# Patient Record
Sex: Female | Born: 1995 | Race: White | Hispanic: No | Marital: Single | Smoking: Never smoker
Health system: Southern US, Community
[De-identification: ages and names within clinical notes are randomized; demographics above are authoritative.]

---
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
## 2015-04-10 ENCOUNTER — Emergency Department (HOSPITAL_COMMUNITY)
Admission: EM | Admit: 2015-04-10 | Discharge: 2015-04-10 | Disposition: A | Discharge status: Home / Self Care | Payer: 59 | Attending: Emergency Medicine | Admitting: Emergency Medicine

## 2015-04-10 ENCOUNTER — Encounter (HOSPITAL_COMMUNITY): Payer: Self-pay

## 2015-04-10 DIAGNOSIS — F1010 Alcohol abuse, uncomplicated: Secondary | ICD-10-CM | POA: Insufficient documentation

## 2015-04-10 DIAGNOSIS — Z3202 Encounter for pregnancy test, result negative: Secondary | ICD-10-CM | POA: Diagnosis not present

## 2015-04-10 DIAGNOSIS — Z880 Allergy status to penicillin: Secondary | ICD-10-CM | POA: Insufficient documentation

## 2015-04-10 DIAGNOSIS — K2920 Alcoholic gastritis without bleeding: Secondary | ICD-10-CM | POA: Diagnosis not present

## 2015-04-10 DIAGNOSIS — R1013 Epigastric pain: Secondary | ICD-10-CM | POA: Diagnosis present

## 2015-04-10 LAB — COMPREHENSIVE METABOLIC PANEL
ALBUMIN: 4.7 g/dL (ref 3.5–5.2)
ALK PHOS: 55 U/L (ref 39–117)
ALT: 85 U/L — ABNORMAL HIGH (ref 0–35)
AST: 89 U/L — AB (ref 0–37)
Anion gap: 15 (ref 5–15)
BILIRUBIN TOTAL: 0.8 mg/dL (ref 0.3–1.2)
BUN: 11 mg/dL (ref 6–23)
CHLORIDE: 109 mmol/L (ref 96–112)
CO2: 17 mmol/L — ABNORMAL LOW (ref 19–32)
Calcium: 9.8 mg/dL (ref 8.4–10.5)
Creatinine, Ser: 0.7 mg/dL (ref 0.50–1.10)
GFR calc Af Amer: >90 mL/min (ref >90)
Glucose, Bld: 111 mg/dL — ABNORMAL HIGH (ref 70–99)
POTASSIUM: 3.3 mmol/L — AB (ref 3.5–5.1)
SODIUM: 141 mmol/L (ref 135–145)
Total Protein: 7.7 g/dL (ref 6.0–8.3)

## 2015-04-10 LAB — CBC WITH DIFFERENTIAL/PLATELET
BASOS ABS: 0 10*3/uL (ref 0.0–0.1)
BASOS PCT: 0 % (ref 0–1)
EOS PCT: 0 % (ref 0–5)
Eosinophils Absolute: 0 10*3/uL (ref 0.0–0.7)
HCT: 36.6 % (ref 36.0–46.0)
HEMOGLOBIN: 12.4 g/dL (ref 12.0–15.0)
LYMPHS ABS: 0.6 10*3/uL — AB (ref 0.7–4.0)
LYMPHS PCT: 7 % — AB (ref 12–46)
MCH: 31.4 pg (ref 26.0–34.0)
MCHC: 33.9 g/dL (ref 30.0–36.0)
MCV: 92.7 fL (ref 78.0–100.0)
Monocytes Absolute: 0.2 10*3/uL (ref 0.1–1.0)
Monocytes Relative: 2 % — ABNORMAL LOW (ref 3–12)
NEUTROS ABS: 7.4 10*3/uL (ref 1.7–7.7)
NEUTROS PCT: 91 % — AB (ref 43–77)
Platelets: 263 10*3/uL (ref 150–400)
RBC: 3.95 MIL/uL (ref 3.87–5.11)
RDW: 11.9 % (ref 11.5–15.5)
WBC: 8.1 10*3/uL (ref 4.0–10.5)

## 2015-04-10 LAB — PREGNANCY, URINE: Preg Test, Ur: NEGATIVE

## 2015-04-10 LAB — URINALYSIS, ROUTINE W REFLEX MICROSCOPIC
BILIRUBIN URINE: NEGATIVE
Glucose, UA: NEGATIVE mg/dL
HGB URINE DIPSTICK: NEGATIVE
Ketones, ur: NEGATIVE mg/dL
Leukocytes, UA: NEGATIVE
Nitrite: NEGATIVE
Protein, ur: NEGATIVE mg/dL
SPECIFIC GRAVITY, URINE: 1.015 (ref 1.005–1.030)
UROBILINOGEN UA: 0.2 mg/dL (ref 0.0–1.0)
pH: 6 (ref 5.0–8.0)

## 2015-04-10 LAB — LIPASE, BLOOD: Lipase: 17 U/L (ref 11–59)

## 2015-04-10 MED ORDER — SODIUM CHLORIDE 0.9 % IV BOLUS (SEPSIS)
1000.0000 mL | Freq: Once | INTRAVENOUS | Status: AC
  Administered 2015-04-10: 1000 mL via INTRAVENOUS

## 2015-04-10 MED ORDER — ONDANSETRON HCL 4 MG/2ML IJ SOLN
4.0000 mg | Freq: Once | INTRAMUSCULAR | Status: AC
  Administered 2015-04-10: 4 mg via INTRAVENOUS
  Filled 2015-04-10: qty 2

## 2015-04-10 MED ORDER — PANTOPRAZOLE SODIUM 40 MG IV SOLR
40.0000 mg | Freq: Once | INTRAVENOUS | Status: AC
  Administered 2015-04-10: 40 mg via INTRAVENOUS
  Filled 2015-04-10: qty 40

## 2015-04-10 MED ORDER — OMEPRAZOLE 20 MG PO CPDR: 20.0000 mg | DELAYED_RELEASE_CAPSULE | Freq: Every day | ORAL | Status: DC

## 2015-04-10 MED ORDER — MORPHINE SULFATE 4 MG/ML IJ SOLN
4.0000 mg | Freq: Once | INTRAMUSCULAR | Status: AC
  Administered 2015-04-10: 4 mg via INTRAVENOUS
  Filled 2015-04-10: qty 1

## 2015-04-10 MED ORDER — ONDANSETRON 8 MG PO TBDP: 8.0000 mg | ORAL_TABLET | Freq: Three times a day (TID) | ORAL | Status: DC | PRN

## 2015-04-10 NOTE — Discharge Instructions (Signed)
Gastritis, Adult °Gastritis is soreness and puffiness (inflammation) of the lining of the stomach. If you do not get help, gastritis can cause bleeding and sores (ulcers) in the stomach. °HOME CARE  °· Only take medicine as told by your doctor. °· If you were given antibiotic medicines, take them as told. Finish the medicines even if you start to feel better. °· Drink enough fluids to keep your pee (urine) clear or pale yellow. °· Avoid foods and drinks that make your problems worse. Foods you may want to avoid include: °¨ Caffeine or alcohol. °¨ Chocolate. °¨ Mint. °¨ Garlic and onions. °¨ Spicy foods. °¨ Citrus fruits, including oranges, lemons, or limes. °¨ Food containing tomatoes, including sauce, chili, salsa, and pizza. °¨ Fried and fatty foods. °· Eat small meals throughout the day instead of large meals. °GET HELP RIGHT AWAY IF:  °· You have black or dark red poop (stools). °· You throw up (vomit) blood. It may look like coffee grounds. °· You cannot keep fluids down. °· Your belly (abdominal) pain gets worse. °· You have a fever. °· You do not feel better after 1 week. °· You have any other questions or concerns. °MAKE SURE YOU:  °· Understand these instructions. °· Will watch your condition. °· Will get help right away if you are not doing well or get worse. °Document Released: 05/25/2008 Document Revised: 02/29/2012 Document Reviewed: 01/20/2012 °[employer]® Patient Information ©2015 [employer]. This information is not intended to replace advice given to you by your health care provider. Make sure you discuss any questions you have with your health care provider. ° °

## 2015-04-10 NOTE — Progress Notes (Signed)
Pt listed in EPIC without insurance coverage but when Cm visited her she states she has coverage but has not been seen by ED registration at this time  [facility] ED CM spoke with pt on how to obtain an in network pcp with insurance coverage via the customer service number or web site  Cm reviewed ED level of care for crisis/emergent services and community pcp level of care to manage continuous or chronic medical concerns.  The pt voiced understanding CM encouraged pt and discussed pt's responsibility to verify with pt's insurance carrier that any recommended medical provider offered by any emergency room or a hospital provider is within the carrier's network. The pt voiced understanding  Cm reviewed generally there [facility] process after labs and imaging have been completed

## 2015-04-10 NOTE — ED Notes (Signed)
Pt with emesis and abdominal pain since this morning.  No fever.  No change in urination.

## 2015-04-10 NOTE — ED Provider Notes (Signed)
CSN: [id number]     Arrival date & time 04/10/15  1409 History   First MD Initiated Contact with Patient 04/10/15 1619     Chief Complaint  Patient presents with  . Abdominal Pain    Patient is a 19 y.o. female presenting with abdominal pain. The history is provided by the patient.  Abdominal Pain Pain location:  Epigastric Pain quality: cramping   Pain radiates to:  Does not radiate Pain severity:  Moderate Onset quality:  Sudden Duration:  1 day (started around 12pm) Timing:  Constant Progression:  Improving Context: not recent illness, not recent travel, not sick contacts, not suspicious food intake and not trauma   Relieved by:  Nothing Worsened by:  Deep breathing Associated symptoms: anorexia, diarrhea (5 times), nausea and vomiting (30 times)   Associated symptoms: no chest pain, no dysuria and no fever    patient does drink alcohol a couple times per week. Yesterday she did have more than usual and had about 5 alcoholic drinks. She has no prior history of pancreatitis. No past surgical history.  History reviewed. No pertinent past medical history. History reviewed. No pertinent past surgical history. History reviewed. No pertinent family history. History  Substance Use Topics  . Smoking status: Never Smoker   . Smokeless tobacco: Not on file  . Alcohol Use: Yes     Comment: social   OB History    No data available     Review of Systems  Constitutional: Negative for fever.  Cardiovascular: Negative for chest pain.  Gastrointestinal: Positive for nausea, vomiting (30 times), abdominal pain, diarrhea (5 times) and anorexia. Negative for blood in stool.  Genitourinary: Negative for dysuria.  All other systems reviewed and are negative.     Allergies  Penicillins  Home Medications   Prior to Admission medications   Medication Sig Start Date End Date Taking? Authorizing Provider  etonogestrel (NEXPLANON) 68 MG IMPL implant 1 each by Subdermal route once.   Yes  Historical Provider, MD  ibuprofen (ADVIL,MOTRIN) 200 MG tablet Take 200 mg by mouth every 6 (six) hours as needed for moderate pain (pain). For pain   Yes Historical Provider, MD   BP 107/68 mmHg  Pulse 93  Temp(Src) 98.3 F (36.8 C) (Oral)  Resp 18  SpO2 100%  LMP 04/03/2015 Physical Exam  Constitutional: She appears well-developed and well-nourished. No distress.  HENT:  Head: Normocephalic and atraumatic.  Right Ear: External ear normal.  Left Ear: External ear normal.  Eyes: Conjunctivae are normal. Right eye exhibits no discharge. Left eye exhibits no discharge. No scleral icterus.  Neck: Neck supple. No tracheal deviation present.  Cardiovascular: Normal rate.   Pulmonary/Chest: Effort normal. No stridor. No respiratory distress.  Musculoskeletal: She exhibits no edema.  Neurological: She is alert. Cranial nerve deficit: no gross deficits.  Skin: Skin is warm and dry. No rash noted.  Psychiatric: She has a normal mood and affect.  Nursing note and vitals reviewed.   ED Course  Procedures (including critical care time) Labs Review Labs Reviewed  CBC WITH DIFFERENTIAL/PLATELET - Abnormal; Notable for the following:    Neutrophils Relative % 91 (*)    Lymphocytes Relative 7 (*)    Lymphs Abs 0.6 (*)    Monocytes Relative 2 (*)    All other components within normal limits  COMPREHENSIVE METABOLIC PANEL - Abnormal; Notable for the following:    Potassium 3.3 (*)    CO2 17 (*)    Glucose, Bld 111 (*)  AST 89 (*)    ALT 85 (*)    All other components within normal limits  URINALYSIS, ROUTINE W REFLEX MICROSCOPIC - Abnormal; Notable for the following:    APPearance CLOUDY (*)    All other components within normal limits  PREGNANCY, URINE  LIPASE, BLOOD     MDM   Final diagnoses:  Gastritis without bleeding due to alcohol    Patient admits that she was drinking heavily yesterday. Her symptoms started the day following. Patient's laboratory tests show mild  increase in her AST and ALT. She has a normal lipase. She does have a decreased bicarbonate level. I suspect her symptoms may be related to her dehydration and decreased oral intake. Patient was given IV fluids in the emergency room and antinausea medications. I doubt an acute surgical condition. Plan will be to discharge home with antacids antinausea medications. Refrain from alcohol consumption.    Linwood DibblesJon Simrit Gohlke, MD 04/10/15 [phone]

## 2015-09-11 ENCOUNTER — Encounter (HOSPITAL_COMMUNITY): Payer: Self-pay | Admitting: Emergency Medicine

## 2015-09-11 ENCOUNTER — Emergency Department (HOSPITAL_COMMUNITY): Payer: 59

## 2015-09-11 ENCOUNTER — Emergency Department (HOSPITAL_COMMUNITY)
Admission: EM | Admit: 2015-09-11 | Discharge: 2015-09-11 | Disposition: A | Discharge status: Home / Self Care | Payer: 59 | Attending: Emergency Medicine | Admitting: Emergency Medicine

## 2015-09-11 DIAGNOSIS — W208XXA Other cause of strike by thrown, projected or falling object, initial encounter: Secondary | ICD-10-CM | POA: Diagnosis not present

## 2015-09-11 DIAGNOSIS — S81012A Laceration without foreign body, left knee, initial encounter: Secondary | ICD-10-CM | POA: Diagnosis not present

## 2015-09-11 DIAGNOSIS — Y9389 Activity, other specified: Secondary | ICD-10-CM | POA: Insufficient documentation

## 2015-09-11 DIAGNOSIS — Y998 Other external cause status: Secondary | ICD-10-CM | POA: Insufficient documentation

## 2015-09-11 DIAGNOSIS — Z880 Allergy status to penicillin: Secondary | ICD-10-CM | POA: Diagnosis not present

## 2015-09-11 DIAGNOSIS — Z79899 Other long term (current) drug therapy: Secondary | ICD-10-CM | POA: Insufficient documentation

## 2015-09-11 DIAGNOSIS — Y92000 Kitchen of unspecified non-institutional (private) residence as  the place of occurrence of the external cause: Secondary | ICD-10-CM | POA: Insufficient documentation

## 2015-09-11 DIAGNOSIS — S8992XA Unspecified injury of left lower leg, initial encounter: Secondary | ICD-10-CM | POA: Diagnosis present

## 2015-09-11 MED ORDER — LIDOCAINE-EPINEPHRINE (PF) 2 %-1:200000 IJ SOLN
INTRAMUSCULAR | Status: AC
  Filled 2015-09-11: qty 20

## 2015-09-11 MED ORDER — LIDOCAINE-EPINEPHRINE (PF) 2 %-1:200000 IJ SOLN
10.0000 mL | Freq: Once | INTRAMUSCULAR | Status: AC
  Administered 2015-09-11: 22:00:00

## 2015-09-11 MED ORDER — IBUPROFEN 600 MG PO TABS: 600.0000 mg | ORAL_TABLET | Freq: Four times a day (QID) | ORAL | Status: DC | PRN

## 2015-09-11 MED ORDER — BACITRACIN ZINC 500 UNIT/GM EX OINT: 1.0000 "application " | TOPICAL_OINTMENT | Freq: Two times a day (BID) | CUTANEOUS | Status: DC

## 2015-09-11 NOTE — ED Notes (Signed)
Pt appears anxious, after leaving the room pulse rate dropped to 117. Alerted PA to tachycardia, will continue to monitor.

## 2015-09-11 NOTE — ED Notes (Signed)
Pt states she was carrying a glass jar, slipped, the glass jar fell and broke, left knee landed on broken glass. States it hurts to walk, and states she's not sure if there could be more glass in her knee.

## 2015-09-11 NOTE — ED Notes (Signed)
Pt reports falling on broken glass tonight.  2 small lacs noted to L knee.

## 2015-09-11 NOTE — Discharge Instructions (Signed)

## 2015-09-11 NOTE — ED Provider Notes (Signed)
CSN: [id number]     Arrival date & time 09/11/15  2026 History  This chart was scribed for Antony Madura, PA-C, working with Bethann Berkshire, MD by Octavia Heir, ED Scribe. This patient was seen in room WTR9/WTR9 and the patient's care was started at 8:48 PM.    Chief Complaint  Patient presents with  . Knee Pain    The history is provided by the patient. No language interpreter was used.   HPI Comments: Tamara Kent is a 19 y.o. female who presents to the Emergency Department complaining of a sudden onset left knee injury that occurred 30 minutes PTA. Pt reports that she was carrying a glass jar in her kitchen when she slipped on a wet puddle, dropped the glass jar and landed her left knee on the glass. She reports it is painful to walk and she is unsure if there is any glass in her knee. Immunizations UTD. She denies numbness and loss of sensation in her left knee.  No past medical history on file. No past surgical history on file. No family history on file. Social History  Substance Use Topics  . Smoking status: Never Smoker   . Smokeless tobacco: Not on file  . Alcohol Use: Yes     Comment: social   OB History    No data available      Review of Systems  Skin: Positive for wound.  Neurological: Negative for numbness.  All other systems reviewed and are negative.   Allergies  Penicillins  Home Medications   Prior to Admission medications   Medication Sig Start Date End Date Taking? Authorizing Provider  etonogestrel (NEXPLANON) 68 MG IMPL implant 1 each by Subdermal route once.    Historical Provider, MD  ibuprofen (ADVIL,MOTRIN) 200 MG tablet Take 200 mg by mouth every 6 (six) hours as needed for moderate pain (pain). For pain    Historical Provider, MD  omeprazole (PRILOSEC) 20 MG capsule Take 1 capsule (20 mg total) by mouth daily. 04/10/15   Linwood Dibbles, MD  ondansetron (ZOFRAN ODT) 8 MG disintegrating tablet Take 1 tablet (8 mg total) by mouth every 8 (eight) hours as  needed for nausea or vomiting. 04/10/15   Linwood Dibbles, MD   Triage vitals: BP 133/86 mmHg  Pulse 137  Temp(Src) 98.9 F (37.2 C) (Oral)  Resp 18  SpO2 99%  Physical Exam  Constitutional: She is oriented to person, place, and time. She appears well-developed and well-nourished. No distress.  Nontoxic/nonseptic appearing  HENT:  Head: Normocephalic and atraumatic.  Eyes: Conjunctivae and EOM are normal. No scleral icterus.  Neck: Normal range of motion.  Cardiovascular: Normal rate, regular rhythm and intact distal pulses.   DP and PT pulses 2+ in the LLE  Pulmonary/Chest: Effort normal. No respiratory distress.  Respirations even and unlabored  Musculoskeletal: Normal range of motion.       Left knee: She exhibits laceration. She exhibits normal range of motion, no effusion, no deformity, no LCL laxity and no MCL laxity. Tenderness found.       Legs: Neurological: She is alert and oriented to person, place, and time. She exhibits normal muscle tone. Coordination normal.  Sensation to light touch intact  Skin: Skin is warm and dry. No rash noted. She is not diaphoretic. No erythema. No pallor.  1cm laceration x 2 to lateral L knee. No visible or palpable foreign body  Psychiatric: She has a normal mood and affect. Her behavior is normal.  Nursing note and  vitals reviewed.   ED Course  Procedures  DIAGNOSTIC STUDIES: Oxygen Saturation is 99% on RA, normal by my interpretation.  COORDINATION OF CARE:  8:50 PM Discussed treatment plan which includes x-ray left knee and sutures to area with pt at bedside and pt agreed to plan.  Labs Review Labs Reviewed - No data to display  Imaging Review Dg Knee Complete 4 Views Left  09/11/2015   CLINICAL DATA:  Left knee injury. Pt states she was carrying a glass jar in her kitchen , slipped on water, the glass jar fell and broke, left knee landed on broken glass. States it hurts to walk, and states she's not sure if there could be more glass  in her knee.  EXAM: LEFT KNEE - COMPLETE 4+ VIEW  COMPARISON:  None.  FINDINGS: There is no evidence of fracture, dislocation, or joint effusion. There is no evidence of arthropathy or other focal bone abnormality. Soft tissues are unremarkable.  IMPRESSION: Negative.   Electronically Signed   By: Norva Pavlov M.D.   On: 09/11/2015 21:40     I have personally reviewed and evaluated these images and lab results as part of my medical decision-making.   EKG Interpretation None      LACERATION REPAIR Performed by: Antony Madura Authorized by: Antony Madura Consent: Verbal consent obtained. Risks and benefits: risks, benefits and alternatives were discussed Consent given by: patient Patient identity confirmed: provided demographic data Prepped and Draped in normal sterile fashion Wound explored  Laceration Location: L lateral knee  Laceration Length: 1cm  No Foreign Bodies seen or palpated  Anesthesia: local infiltration  Local anesthetic: lidocaine 2% with epinephrine  Anesthetic total: 3 ml  Irrigation method: syringe Amount of cleaning: standard  Skin closure: 4-0 ethilon  Number of sutures: 3  Technique: simple interrupted  Patient tolerance: Patient tolerated the procedure well with no immediate complications.  LACERATION REPAIR Performed by: Antony Madura Authorized by: Antony Madura Consent: Verbal consent obtained. Risks and benefits: risks, benefits and alternatives were discussed Consent given by: patient Patient identity confirmed: provided demographic data Prepped and Draped in normal sterile fashion Wound explored  Laceration Location: L lateral knee  Laceration Length: 1cm  No Foreign Bodies seen or palpated  Anesthesia: local infiltration  Local anesthetic: lidocaine 2% with epinephrine  Anesthetic total: 3 ml  Irrigation method: syringe Amount of cleaning: standard  Skin closure: 4-0 ethilon  Number of sutures: 2  Technique: simple  interrupted  Patient tolerance: Patient tolerated the procedure well with no immediate complications.  MDM   Final diagnoses:  Laceration of knee, left, initial encounter    Tdap booster UTD. Laceration occurred < 8 hours prior to repair which was well tolerated. Pt has no comorbidities to effect normal wound healing. Discussed suture home care with pt and answered questions. Pt to follow up for wound check and suture removal in 10 days. Pt is hemodynamically stable with no complaints prior to discharge   I personally performed the services described in this documentation, which was scribed in my presence. The recorded information has been reviewed and is accurate.    Antony Madura, PA-C 09/11/15 2207  Bethann Berkshire, MD 09/13/15 [phone]

## 2016-05-13 ENCOUNTER — Encounter (HOSPITAL_COMMUNITY): Payer: Self-pay | Admitting: Nurse Practitioner

## 2016-05-13 ENCOUNTER — Emergency Department (HOSPITAL_COMMUNITY)
Admission: EM | Admit: 2016-05-13 | Discharge: 2016-05-14 | Disposition: A | Discharge status: Home / Self Care | Payer: BLUE CROSS/BLUE SHIELD | Attending: Emergency Medicine | Admitting: Emergency Medicine

## 2016-05-13 DIAGNOSIS — Z7982 Long term (current) use of aspirin: Secondary | ICD-10-CM | POA: Insufficient documentation

## 2016-05-13 DIAGNOSIS — Z79899 Other long term (current) drug therapy: Secondary | ICD-10-CM | POA: Insufficient documentation

## 2016-05-13 DIAGNOSIS — R197 Diarrhea, unspecified: Secondary | ICD-10-CM | POA: Insufficient documentation

## 2016-05-13 DIAGNOSIS — R112 Nausea with vomiting, unspecified: Secondary | ICD-10-CM | POA: Insufficient documentation

## 2016-05-13 LAB — CBC
HEMATOCRIT: 37.1 % (ref 36.0–46.0)
Hemoglobin: 13.6 g/dL (ref 12.0–15.0)
MCH: 32.4 pg (ref 26.0–34.0)
MCHC: 36.7 g/dL — AB (ref 30.0–36.0)
MCV: 88.3 fL (ref 78.0–100.0)
Platelets: 230 10*3/uL (ref 150–400)
RBC: 4.2 MIL/uL (ref 3.87–5.11)
RDW: 11.7 % (ref 11.5–15.5)
WBC: 8.6 10*3/uL (ref 4.0–10.5)

## 2016-05-13 LAB — COMPREHENSIVE METABOLIC PANEL
ALBUMIN: 5.1 g/dL — AB (ref 3.5–5.0)
ALT: 26 U/L (ref 14–54)
AST: 36 U/L (ref 15–41)
Alkaline Phosphatase: 48 U/L (ref 38–126)
Anion gap: 10 (ref 5–15)
BUN: 17 mg/dL (ref 6–20)
CHLORIDE: 106 mmol/L (ref 101–111)
CO2: 21 mmol/L — AB (ref 22–32)
CREATININE: 0.81 mg/dL (ref 0.44–1.00)
Calcium: 9.4 mg/dL (ref 8.9–10.3)
GFR calc Af Amer: >60 mL/min (ref >60)
GFR calc non Af Amer: >60 mL/min (ref >60)
GLUCOSE: 102 mg/dL — AB (ref 65–99)
POTASSIUM: 3.2 mmol/L — AB (ref 3.5–5.1)
SODIUM: 137 mmol/L (ref 135–145)
Total Bilirubin: 1.4 mg/dL — ABNORMAL HIGH (ref 0.3–1.2)
Total Protein: 7.6 g/dL (ref 6.5–8.1)

## 2016-05-13 LAB — I-STAT BETA HCG BLOOD, ED (MC, WL, AP ONLY): I-stat hCG, quantitative: <5 m[IU]/mL (ref <5)

## 2016-05-13 LAB — LIPASE, BLOOD: LIPASE: 21 U/L (ref 11–51)

## 2016-05-13 MED ORDER — SODIUM CHLORIDE 0.9 % IV BOLUS (SEPSIS)
2000.0000 mL | Freq: Once | INTRAVENOUS | Status: AC
  Administered 2016-05-14: 2000 mL via INTRAVENOUS

## 2016-05-13 MED ORDER — ONDANSETRON HCL 4 MG/2ML IJ SOLN
4.0000 mg | Freq: Once | INTRAMUSCULAR | Status: AC
  Administered 2016-05-14: 4 mg via INTRAVENOUS
  Filled 2016-05-13: qty 2

## 2016-05-13 NOTE — ED Notes (Signed)
Pt c/o of severe vomiting episodes since yesterday morning, neck pain that sounds similar to nuchal rigidity that radiates down her spine, pain on her shoulder blades. Also reports tingling in her fingers with fever and chills.

## 2016-05-13 NOTE — ED Notes (Signed)
Pt aware of need for urine  

## 2016-05-13 NOTE — ED Provider Notes (Signed)
CSN: [id number]     Arrival date & time 05/13/16  2218 History   First MD Initiated Contact with Patient 05/13/16 2331     Chief Complaint  Patient presents with  . Emesis  . Nausea  . Neck Pain     (Consider location/radiation/quality/duration/timing/severity/associated sxs/prior Treatment) HPI   Tamara Kent is a 20 y.o. female  presents for evaluation of nausea, vomiting, diarrhea, which started suddenly yesterday. She also has pain in the middle of her back. She denies headache or neck pain. She has run chills, but not taken her temperature. She denies cough, shortness of breath, weakness or dizziness. She has any known sick contacts. She has not eaten any abnormal fluid. There are no other no modifying factors.   History reviewed. No pertinent past medical history. History reviewed. No pertinent past surgical history. History reviewed. No pertinent family history. Social History  Substance Use Topics  . Smoking status: Never Smoker   . Smokeless tobacco: None  . Alcohol Use: Yes     Comment: social   OB History    No data available     Review of Systems  All other systems reviewed and are negative.     Allergies  Penicillins  Home Medications   Prior to Admission medications   Medication Sig Start Date End Date Taking? Authorizing Provider  Aspirin-Acetaminophen (GOODYS BODY PAIN PO) Take 1 each by mouth once.   Yes Historical Provider, MD  etonogestrel (NEXPLANON) 68 MG IMPL implant 1 each by Subdermal route once.   Yes Historical Provider, MD  Multiple Vitamins-Minerals (MULTIVITAMIN ADULT PO) Take 1 tablet by mouth daily.   Yes Historical Provider, MD  bacitracin ointment Apply 1 application topically 2 (two) times daily. Patient not taking: Reported on 05/13/2016 09/11/15   Antony MaduraKelly Humes, PA-C  ondansetron (ZOFRAN) 8 MG tablet Take 1 tablet (8 mg total) by mouth every 8 (eight) hours as needed for nausea or vomiting. 05/14/16   Mancel BaleElliott Marlyce Mcdougald, MD   BP 106/61 mmHg   Pulse 82  Temp(Src) 98.6 F (37 C) (Oral)  Resp 20  SpO2 100%  LMP 04/29/2016 (Approximate) Physical Exam  Constitutional: She is oriented to person, place, and time. She appears well-developed and well-nourished. No distress (She is able to sit up easily on the side of the bed, for examination.).  HENT:  Head: Normocephalic and atraumatic.  Right Ear: External ear normal.  Left Ear: External ear normal.  Eyes: Conjunctivae and EOM are normal. Pupils are equal, round, and reactive to light.  Neck: Normal range of motion and phonation normal. Neck supple. No tracheal deviation present. No thyromegaly present.  No meningismus  Cardiovascular: Normal rate, regular rhythm and normal heart sounds.   Pulmonary/Chest: Effort normal and breath sounds normal. No respiratory distress. She exhibits no bony tenderness.  Abdominal: Soft. There is no tenderness.  Musculoskeletal: Normal range of motion.  Lymphadenopathy:    She has no cervical adenopathy.  Neurological: She is alert and oriented to person, place, and time. No cranial nerve deficit or sensory deficit. She exhibits normal muscle tone. Coordination normal.  No dysarthria and aphasia or nystagmus  Skin: Skin is warm, dry and intact.  Psychiatric: She has a normal mood and affect. Her behavior is normal. Judgment and thought content normal.  Nursing note and vitals reviewed.   ED Course  Procedures (including critical care time)  Initial clinical impression- nonspecific, nausea, vomiting, diarrhea, differential diagnosis includes viral process versus food toxin/infection.  Medications  sodium chloride  0.9 % bolus 2,000 mL (2,000 mLs Intravenous New Bag/Given 05/14/16 0015)  ondansetron (ZOFRAN) injection 4 mg (4 mg Intravenous Given 05/14/16 0015)    Patient Vitals for the past 24 hrs:  BP Temp Temp src Pulse Resp SpO2  05/13/16 2243 106/61 mmHg 98.6 F (37 C) Oral 82 20 100 %    12:46 AM Reevaluation with update and  discussion. After initial assessment and treatment, an updated evaluation reveals She is tolerating oral liquids now. She feels better after IV fluids. Findings discussed with the patient, and mother, all questions answered. Tamara Kent    Labs Review Labs Reviewed  COMPREHENSIVE METABOLIC PANEL - Abnormal; Notable for the following:    Potassium 3.2 (*)    CO2 21 (*)    Glucose, Bld 102 (*)    Albumin 5.1 (*)    Total Bilirubin 1.4 (*)    All other components within normal limits  CBC - Abnormal; Notable for the following:    MCHC 36.7 (*)    All other components within normal limits  URINALYSIS, ROUTINE W REFLEX MICROSCOPIC (NOT AT [facility]) - Abnormal; Notable for the following:    Ketones, ur >80 (*)    All other components within normal limits  LIPASE, BLOOD  I-STAT BETA HCG BLOOD, ED ([facility], [facility], [facility] ONLY)    Imaging Review No results found. I have personally reviewed and evaluated these images and lab results as part of my medical decision-making.   EKG Interpretation None      MDM   Final diagnoses:  Nausea vomiting and diarrhea    Specific nausea, vomiting, diarrhea. Doubt meningitis, serious bacterial infection or impending vascular collapse.   Nursing Notes Reviewed/ Care Coordinated Applicable Imaging Reviewed Interpretation of Laboratory Data incorporated into ED treatment  The patient appears reasonably screened and/or stabilized for discharge and I doubt any other medical condition or other [facility] requiring further screening, evaluation, or treatment in the ED at this time prior to discharge.  Plan: Home Medications- Zofran; Home Treatments- gradually advance diet, rest; return here if the recommended treatment, does not improve the symptoms; Recommended follow up- PCP prn     Mancel Bale, MD 05/14/16 [phone]

## 2016-05-14 LAB — URINALYSIS, ROUTINE W REFLEX MICROSCOPIC
Bilirubin Urine: NEGATIVE
GLUCOSE, UA: NEGATIVE mg/dL
Hgb urine dipstick: NEGATIVE
LEUKOCYTES UA: NEGATIVE
Nitrite: NEGATIVE
PH: 5.5 (ref 5.0–8.0)
Protein, ur: NEGATIVE mg/dL
SPECIFIC GRAVITY, URINE: 1.022 (ref 1.005–1.030)

## 2016-05-14 MED ORDER — ONDANSETRON HCL 8 MG PO TABS: 8.0000 mg | ORAL_TABLET | Freq: Three times a day (TID) | ORAL | Status: DC | PRN

## 2016-05-14 NOTE — ED Notes (Signed)
Pt able to tolerate Sprite without difficulty or vomiting

## 2016-05-14 NOTE — Discharge Instructions (Signed)
Nausea and Vomiting °Nausea is a sick feeling that often comes before throwing up (vomiting). Vomiting is a reflex where stomach contents come out of your mouth. Vomiting can cause severe loss of body fluids (dehydration). Children and elderly adults can become dehydrated quickly, especially if they also have diarrhea. Nausea and vomiting are symptoms of a condition or disease. It is important to find the cause of your symptoms. °CAUSES  °· Direct irritation of the stomach lining. This irritation can result from increased acid production (gastroesophageal reflux disease), infection, food poisoning, taking certain medicines (such as nonsteroidal anti-inflammatory drugs), alcohol use, or tobacco use. °· Signals from the brain. These signals could be caused by a headache, heat exposure, an inner ear disturbance, increased pressure in the brain from injury, infection, a tumor, or a concussion, pain, emotional stimulus, or metabolic problems. °· An obstruction in the gastrointestinal tract (bowel obstruction). °· Illnesses such as diabetes, hepatitis, gallbladder problems, appendicitis, kidney problems, cancer, sepsis, atypical symptoms of a heart attack, or eating disorders. °· Medical treatments such as chemotherapy and radiation. °· Receiving medicine that makes you sleep (general anesthetic) during surgery. °DIAGNOSIS °Your caregiver may ask for tests to be done if the problems do not improve after a few days. Tests may also be done if symptoms are severe or if the reason for the nausea and vomiting is not clear. Tests may include: °· Urine tests. °· Blood tests. °· Stool tests. °· Cultures (to look for evidence of infection). °· X-rays or other imaging studies. °Test results can help your caregiver make decisions about treatment or the need for additional tests. °TREATMENT °You need to stay well hydrated. Drink frequently but in small amounts. You may wish to drink water, sports drinks, clear broth, or eat frozen  ice pops or gelatin dessert to help stay hydrated. When you eat, eating slowly may help prevent nausea. There are also some antinausea medicines that may help prevent nausea. °HOME CARE INSTRUCTIONS  °· Take all medicine as directed by your caregiver. °· If you do not have an appetite, do not force yourself to eat. However, you must continue to drink fluids. °· If you have an appetite, eat a normal diet unless your caregiver tells you differently. °· Eat a variety of complex carbohydrates (rice, wheat, potatoes, bread), lean meats, yogurt, fruits, and vegetables. °· Avoid high-fat foods because they are more difficult to digest. °· Drink enough water and fluids to keep your urine clear or pale yellow. °· If you are dehydrated, ask your caregiver for specific rehydration instructions. Signs of dehydration may include: °· Severe thirst. °· Dry lips and mouth. °· Dizziness. °· Dark urine. °· Decreasing urine frequency and amount. °· Confusion. °· Rapid breathing or pulse. °SEEK IMMEDIATE MEDICAL CARE IF:  °· You have blood or brown flecks (like coffee grounds) in your vomit. °· You have black or bloody stools. °· You have a severe headache or stiff neck. °· You are confused. °· You have severe abdominal pain. °· You have chest pain or trouble breathing. °· You do not urinate at least once every 8 hours. °· You develop cold or clammy skin. °· You continue to vomit for longer than 24 to 48 hours. °· You have a fever. °MAKE SURE YOU:  °· Understand these instructions. °· Will watch your condition. °· Will get help right away if you are not doing well or get worse. °  °This information is not intended to replace advice given to you by your health care provider. Make sure   you discuss any questions you have with your health care provider.   Document Released: 12/07/2005 Document Revised: 02/29/2012 Document Reviewed: 05/06/2011 Elsevier Interactive Patient Education 2016 [place].  Diarrhea Diarrhea is frequent  loose and watery bowel movements. It can cause you to feel weak and dehydrated. Dehydration can cause you to become tired and thirsty, have a dry mouth, and have decreased urination that often is dark yellow. Diarrhea is a sign of another problem, most often an infection that will not last long. In most cases, diarrhea typically lasts 2-3 days. However, it can last longer if it is a sign of something more serious. It is important to treat your diarrhea as directed by your caregiver to lessen or prevent future episodes of diarrhea. CAUSES  Some common causes include:  Gastrointestinal infections caused by viruses, bacteria, or parasites.  Food poisoning or food allergies.  Certain medicines, such as antibiotics, chemotherapy, and laxatives.  Artificial sweeteners and fructose.  Digestive disorders. HOME CARE INSTRUCTIONS  Ensure adequate fluid intake (hydration): Have 1 cup (8 oz) of fluid for each diarrhea episode. Avoid fluids that contain simple sugars or sports drinks, fruit juices, whole milk products, and sodas. Your urine should be clear or pale yellow if you are drinking enough fluids. Hydrate with an oral rehydration solution that you can purchase at pharmacies, retail stores, and online. You can prepare an oral rehydration solution at home by mixing the following ingredients together:   - tsp table salt.   tsp baking soda.   tsp salt substitute containing potassium chloride.  1  tablespoons sugar.  1 L (34 oz) of water.  Certain foods and beverages may increase the speed at which food moves through the gastrointestinal (GI) tract. These foods and beverages should be avoided and include:  Caffeinated and alcoholic beverages.  High-fiber foods, such as raw fruits and vegetables, nuts, seeds, and whole grain breads and cereals.  Foods and beverages sweetened with sugar alcohols, such as xylitol, sorbitol, and mannitol.  Some foods may be well tolerated and may help thicken  stool including:  Starchy foods, such as rice, toast, pasta, low-sugar cereal, oatmeal, grits, baked potatoes, crackers, and bagels.  Bananas.  Applesauce.  Add probiotic-rich foods to help increase healthy bacteria in the GI tract, such as yogurt and fermented milk products.  Wash your hands well after each diarrhea episode.  Only take over-the-counter or prescription medicines as directed by your caregiver.  Take a warm bath to relieve any burning or pain from frequent diarrhea episodes. SEEK IMMEDIATE MEDICAL CARE IF:   You are unable to keep fluids down.  You have persistent vomiting.  You have blood in your stool, or your stools are black and tarry.  You do not urinate in 6-8 hours, or there is only a small amount of very dark urine.  You have abdominal pain that increases or localizes.  You have weakness, dizziness, confusion, or light-headedness.  You have a severe headache.  Your diarrhea gets worse or does not get better.  You have a fever or persistent symptoms for more than 2-3 days.  You have a fever and your symptoms suddenly get worse. MAKE SURE YOU:   Understand these instructions.  Will watch your condition.  Will get help right away if you are not doing well or get worse.   This information is not intended to replace advice given to you by your health care provider. Make sure you discuss any questions you have with your health care  provider.   Document Released: 11/27/2002 Document Revised: 12/28/2014 Document Reviewed: 08/14/2012 Elsevier Interactive Patient Education 2016 [employer]  Food Choices to Help Relieve Diarrhea, Adult When you have diarrhea, the foods you eat and your eating habits are very important. Choosing the right foods and drinks can help relieve diarrhea. Also, because diarrhea can last up to 7 days, you need to replace lost fluids and electrolytes (such as sodium, potassium, and chloride) in order to help prevent dehydration.   WHAT GENERAL GUIDELINES DO I NEED TO FOLLOW?  Slowly drink 1 cup (8 oz) of fluid for each episode of diarrhea. If you are getting enough fluid, your urine will be clear or pale yellow.  Eat starchy foods. Some good choices include white rice, white toast, pasta, low-fiber cereal, baked potatoes (without the skin), saltine crackers, and bagels.  Avoid large servings of any cooked vegetables.  Limit fruit to two servings per day. A serving is  cup or 1 small piece.  Choose foods with less than 2 g of fiber per serving.  Limit fats to less than 8 tsp (38 g) per day.  Avoid fried foods.  Eat foods that have probiotics in them. Probiotics can be found in certain dairy products.  Avoid foods and beverages that may increase the speed at which food moves through the stomach and intestines (gastrointestinal tract). Things to avoid include:  High-fiber foods, such as dried fruit, raw fruits and vegetables, nuts, seeds, and whole grain foods.  Spicy foods and high-fat foods.  Foods and beverages sweetened with high-fructose corn syrup, honey, or sugar alcohols such as xylitol, sorbitol, and mannitol. WHAT FOODS ARE RECOMMENDED? Grains White rice. White, [place], or pita breads (fresh or toasted), including plain rolls, buns, or bagels. White pasta. Saltine, soda, or graham crackers. Pretzels. Low-fiber cereal. Cooked cereals made with water (such as cornmeal, farina, or cream cereals). Plain muffins. Matzo. Melba toast. Zwieback.  Vegetables Potatoes (without the skin). Strained tomato and vegetable juices. Most well-cooked and canned vegetables without seeds. Tender lettuce. Fruits Cooked or canned applesauce, apricots, cherries, fruit cocktail, grapefruit, peaches, pears, or plums. Fresh bananas, apples without skin, cherries, grapes, cantaloupe, grapefruit, peaches, oranges, or plums.  Meat and Other Protein Products Baked or boiled chicken. Eggs. Tofu. Fish. Seafood. Smooth peanut butter.  Ground or well-cooked tender beef, ham, veal, lamb, pork, or poultry.  Dairy Plain yogurt, kefir, and unsweetened liquid yogurt. Lactose-free milk, buttermilk, or soy milk. Plain hard cheese. Beverages Sport drinks. Clear broths. Diluted fruit juices (except prune). Regular, caffeine-free sodas such as ginger ale. Water. Decaffeinated teas. Oral rehydration solutions. Sugar-free beverages not sweetened with sugar alcohols. Other Bouillon, broth, or soups made from recommended foods.  The items listed above may not be a complete list of recommended foods or beverages. Contact your dietitian for more options. WHAT FOODS ARE NOT RECOMMENDED? Grains Whole grain, whole wheat, bran, or rye breads, rolls, pastas, crackers, and cereals. Wild or brown rice. Cereals that contain more than 2 g of fiber per serving. Corn tortillas or taco shells. Cooked or dry oatmeal. Granola. Popcorn. Vegetables Raw vegetables. Cabbage, broccoli, Brussels sprouts, artichokes, baked beans, beet greens, corn, kale, legumes, peas, sweet potatoes, and yams. Potato skins. Cooked spinach and cabbage. Fruits Dried fruit, including raisins and dates. Raw fruits. Stewed or dried prunes. Fresh apples with skin, apricots, mangoes, pears, raspberries, and strawberries.  Meat and Other Protein Products Chunky peanut butter. Nuts and seeds. Beans and lentils. Tomasa BlaseBacon.  Dairy High-fat cheeses. Milk, chocolate milk,  and beverages made with milk, such as milk shakes. Cream. Ice cream. Sweets and Desserts Sweet rolls, doughnuts, and sweet breads. Pancakes and waffles. Fats and Oils Butter. Cream sauces. Margarine. Salad oils. Plain salad dressings. Olives. Avocados.  Beverages Caffeinated beverages (such as coffee, tea, soda, or energy drinks). Alcoholic beverages. Fruit juices with pulp. Prune juice. Soft drinks sweetened with high-fructose corn syrup or sugar alcohols. Other Coconut. Hot sauce. Chili powder. Mayonnaise. Gravy.  Cream-based or milk-based soups.  The items listed above may not be a complete list of foods and beverages to avoid. Contact your dietitian for more information. WHAT SHOULD I DO IF I BECOME DEHYDRATED? Diarrhea can sometimes lead to dehydration. Signs of dehydration include dark urine and dry mouth and skin. If you think you are dehydrated, you should rehydrate with an oral rehydration solution. These solutions can be purchased at pharmacies, retail stores, or online.  Drink -1 cup (120-240 mL) of oral rehydration solution each time you have an episode of diarrhea. If drinking this amount makes your diarrhea worse, try drinking smaller amounts more often. For example, drink 1-3 tsp (5-15 mL) every 5-10 minutes.  A general rule for staying hydrated is to drink 1-2 L of fluid per day. Talk to your health care provider about the specific amount you should be drinking each day. Drink enough fluids to keep your urine clear or pale yellow.   This information is not intended to replace advice given to you by your health care provider. Make sure you discuss any questions you have with your health care provider.   Document Released: 02/27/2004 Document Revised: 12/28/2014 Document Reviewed: 10/30/2013 Elsevier Interactive Patient Education [employer]vier Inc.

## 2019-04-04 ENCOUNTER — Other Ambulatory Visit: Payer: Self-pay

## 2019-04-04 ENCOUNTER — Emergency Department (HOSPITAL_COMMUNITY): Payer: BLUE CROSS/BLUE SHIELD

## 2019-04-04 ENCOUNTER — Emergency Department (HOSPITAL_COMMUNITY)
Admission: EM | Admit: 2019-04-04 | Discharge: 2019-04-04 | Disposition: A | Discharge status: Home / Self Care | Payer: BLUE CROSS/BLUE SHIELD | Attending: Emergency Medicine | Admitting: Emergency Medicine

## 2019-04-04 ENCOUNTER — Encounter (HOSPITAL_COMMUNITY): Payer: Self-pay

## 2019-04-04 DIAGNOSIS — R112 Nausea with vomiting, unspecified: Secondary | ICD-10-CM | POA: Diagnosis present

## 2019-04-04 DIAGNOSIS — E876 Hypokalemia: Secondary | ICD-10-CM | POA: Insufficient documentation

## 2019-04-04 DIAGNOSIS — R1013 Epigastric pain: Secondary | ICD-10-CM | POA: Diagnosis not present

## 2019-04-04 DIAGNOSIS — Z79899 Other long term (current) drug therapy: Secondary | ICD-10-CM | POA: Diagnosis not present

## 2019-04-04 LAB — URINALYSIS, ROUTINE W REFLEX MICROSCOPIC
Bacteria, UA: NONE SEEN
Bilirubin Urine: NEGATIVE
Glucose, UA: NEGATIVE mg/dL
Ketones, ur: 20 mg/dL — AB
Leukocytes,Ua: NEGATIVE
Nitrite: NEGATIVE
Protein, ur: NEGATIVE mg/dL
Specific Gravity, Urine: 1.021 (ref 1.005–1.030)
pH: 6 (ref 5.0–8.0)

## 2019-04-04 LAB — CBC WITH DIFFERENTIAL/PLATELET
Abs Immature Granulocytes: 0.04 10*3/uL (ref 0.00–0.07)
Basophils Absolute: 0.1 10*3/uL (ref 0.0–0.1)
Basophils Relative: 1 %
Eosinophils Absolute: 0 10*3/uL (ref 0.0–0.5)
Eosinophils Relative: 0 %
HCT: 41.4 % (ref 36.0–46.0)
Hemoglobin: 14.5 g/dL (ref 12.0–15.0)
Immature Granulocytes: 1 %
Lymphocytes Relative: 20 %
Lymphs Abs: 1.6 10*3/uL (ref 0.7–4.0)
MCH: 32.4 pg (ref 26.0–34.0)
MCHC: 35 g/dL (ref 30.0–36.0)
MCV: 92.4 fL (ref 80.0–100.0)
Monocytes Absolute: 0.6 10*3/uL (ref 0.1–1.0)
Monocytes Relative: 8 %
Neutro Abs: 5.5 10*3/uL (ref 1.7–7.7)
Neutrophils Relative %: 70 %
Platelets: 304 10*3/uL (ref 150–400)
RBC: 4.48 MIL/uL (ref 3.87–5.11)
RDW: 11.2 % — ABNORMAL LOW (ref 11.5–15.5)
WBC: 7.8 10*3/uL (ref 4.0–10.5)
nRBC: 0 % (ref 0.0–0.2)

## 2019-04-04 LAB — COMPREHENSIVE METABOLIC PANEL
ALT: 70 U/L — ABNORMAL HIGH (ref 0–44)
AST: 51 U/L — ABNORMAL HIGH (ref 15–41)
Albumin: 4.5 g/dL (ref 3.5–5.0)
Alkaline Phosphatase: 42 U/L (ref 38–126)
Anion gap: 13 (ref 5–15)
BUN: 14 mg/dL (ref 6–20)
CO2: 21 mmol/L — ABNORMAL LOW (ref 22–32)
Calcium: 8.9 mg/dL (ref 8.9–10.3)
Chloride: 100 mmol/L (ref 98–111)
Creatinine, Ser: 0.72 mg/dL (ref 0.44–1.00)
GFR calc Af Amer: >60 mL/min (ref >60)
GFR calc non Af Amer: >60 mL/min (ref >60)
Glucose, Bld: 110 mg/dL — ABNORMAL HIGH (ref 70–99)
Potassium: 2.5 mmol/L — CL (ref 3.5–5.1)
Sodium: 134 mmol/L — ABNORMAL LOW (ref 135–145)
Total Bilirubin: 1.2 mg/dL (ref 0.3–1.2)
Total Protein: 8 g/dL (ref 6.5–8.1)

## 2019-04-04 LAB — ETHANOL: Alcohol, Ethyl (B): <10 mg/dL (ref <10)

## 2019-04-04 LAB — ACETAMINOPHEN LEVEL: Acetaminophen (Tylenol), Serum: <10 ug/mL — ABNORMAL LOW (ref 10–30)

## 2019-04-04 LAB — RAPID URINE DRUG SCREEN, HOSP PERFORMED
Amphetamines: NOT DETECTED
Barbiturates: NOT DETECTED
Benzodiazepines: NOT DETECTED
Cocaine: NOT DETECTED
Opiates: NOT DETECTED
Tetrahydrocannabinol: POSITIVE — AB

## 2019-04-04 LAB — I-STAT BETA HCG BLOOD, ED (MC, WL, AP ONLY): I-stat hCG, quantitative: <5 m[IU]/mL (ref <5)

## 2019-04-04 LAB — MAGNESIUM: Magnesium: 2.4 mg/dL (ref 1.7–2.4)

## 2019-04-04 LAB — SALICYLATE LEVEL: Salicylate Lvl: <7 mg/dL (ref 2.8–30.0)

## 2019-04-04 LAB — LIPASE, BLOOD: Lipase: 36 U/L (ref 11–51)

## 2019-04-04 MED ORDER — POTASSIUM CHLORIDE CRYS ER 20 MEQ PO TBCR: 20.0000 meq | EXTENDED_RELEASE_TABLET | Freq: Every day | ORAL | 0 refills | Status: DC

## 2019-04-04 MED ORDER — ONDANSETRON HCL 4 MG/2ML IJ SOLN
4.0000 mg | Freq: Once | INTRAMUSCULAR | Status: AC
  Administered 2019-04-04: 4 mg via INTRAVENOUS
  Filled 2019-04-04: qty 2

## 2019-04-04 MED ORDER — SODIUM CHLORIDE 0.9 % IV BOLUS
1000.0000 mL | Freq: Once | INTRAVENOUS | Status: AC
  Administered 2019-04-04: 1000 mL via INTRAVENOUS

## 2019-04-04 MED ORDER — ONDANSETRON 4 MG PO TBDP: ORAL_TABLET | ORAL | 0 refills | Status: DC

## 2019-04-04 MED ORDER — DICYCLOMINE HCL 20 MG PO TABS: 20.0000 mg | ORAL_TABLET | Freq: Two times a day (BID) | ORAL | 0 refills | Status: AC | PRN

## 2019-04-04 MED ORDER — POTASSIUM CHLORIDE 10 MEQ/100ML IV SOLN
10.0000 meq | INTRAVENOUS | Status: AC
  Administered 2019-04-04 (×3): 10 meq via INTRAVENOUS
  Filled 2019-04-04 (×3): qty 100

## 2019-04-04 MED ORDER — POTASSIUM CHLORIDE CRYS ER 20 MEQ PO TBCR
40.0000 meq | EXTENDED_RELEASE_TABLET | Freq: Once | ORAL | Status: AC
  Administered 2019-04-04: 40 meq via ORAL
  Filled 2019-04-04: qty 2

## 2019-04-04 MED ORDER — PROMETHAZINE HCL 25 MG/ML IJ SOLN
12.5000 mg | Freq: Once | INTRAMUSCULAR | Status: AC
  Administered 2019-04-04: 12.5 mg via INTRAVENOUS
  Filled 2019-04-04: qty 1

## 2019-04-04 NOTE — ED Notes (Signed)
RN rounded on pt. Pt reports decreased nausea and no pain at this time. RN asked if patient would attempt a urine sample and patient reports she still feels unable to go at this time.  Pt has phone in bed for pharmacy to speak to her. Pt is alert and in no active distress at this time.  Will continue to monitor

## 2019-04-04 NOTE — ED Notes (Signed)
Ultrasound at Bedside 

## 2019-04-04 NOTE — ED Notes (Signed)
PT state felt sick after fluid challenge

## 2019-04-04 NOTE — ED Triage Notes (Addendum)
Pt arrives via POV from home. Pt reports that since Friday she has had abd pain and vomiting. Pt states "I cant keep anything down" Denies fevers, denies COVID exposure, Denies cough, SHOB.

## 2019-04-04 NOTE — ED Notes (Signed)
Patient ambulated to bathroom with nt assistance.

## 2019-04-04 NOTE — ED Provider Notes (Signed)
[facility]-EMERGENCY DEPT Provider Note   CSN: [id number] Arrival date & time: 04/04/19  1141    History   Chief Complaint Chief Complaint  Patient presents with  . Emesis  . Abdominal Pain    HPI Tamara Kent is a 23 y.o. female otherwise healthy here presenting with vomiting, epigastric pain.  Patient states that she has been vomiting for the last 5 days.  Patient states that she also has epigastric pain.  He said the pain is crampy in feeling and radiates to the right upper quadrant.  Denies any blood in her vomit.  Patient denies any fevers or chills or chest pain.  Patient denies any recent travel or sick contacts or eating bad food.  Patient denies any history of gallbladder surgery.  She states that every time she drinks anything she just throws it back up.  Patient states that she occasionally drinks alcohol but none recently.  Patient does take some Goody powder occasionally but denies any overdose on aspirin or Tylenol.      The history is provided by the patient.    History reviewed. No pertinent past medical history.  There are no active problems to display for this patient.   History reviewed. No pertinent surgical history.   OB History   No obstetric history on file.      Home Medications    Prior to Admission medications   Medication Sig Start Date End Date Taking? Authorizing Provider  anti-nausea (EMETROL) solution Take 10 mLs by mouth every 15 (fifteen) minutes as needed for nausea or vomiting.   Yes [provider]  bismuth subsalicylate (PEPTO BISMOL) 262 MG/15ML suspension Take 30 mLs by mouth every 6 (six) hours as needed for indigestion or diarrhea or loose stools.   Yes [provider]  ibuprofen (ADVIL,MOTRIN) 200 MG tablet Take 400 mg by mouth every 6 (six) hours as needed for fever, headache or moderate pain.   Yes [provider]  Multiple Vitamin (MULTIVITAMIN WITH MINERALS) TABS tablet Take 1  tablet by mouth daily.   Yes [provider]  Burr Medico 150-35 MCG/24HR transdermal patch Apply 1 patch topically once a week. 02/26/19  Yes [provider]  bacitracin ointment Apply 1 application topically 2 (two) times daily. Patient not taking: Reported on 05/13/2016 09/11/15   Antony Madura, PA-C  dicyclomine (BENTYL) 20 MG tablet Take 1 tablet (20 mg total) by mouth 2 (two) times daily as needed for spasms. 04/04/19   Charlynne Pander, MD  ondansetron (ZOFRAN ODT) 4 MG disintegrating tablet  ODT q4 hours prn nausea/vomit 04/04/19   Charlynne Pander, MD  ondansetron (ZOFRAN) 8 MG tablet Take 1 tablet (8 mg total) by mouth every 8 (eight) hours as needed for nausea or vomiting. Patient not taking: Reported on 04/04/2019 05/14/16   Mancel Bale, MD  potassium chloride SA (K-DUR,KLOR-CON) 20 MEQ tablet Take 1 tablet (20 mEq total) by mouth daily. 04/04/19   Charlynne Pander, MD    Family History History reviewed. No pertinent family history.  Social History Social History   Tobacco Use  . Smoking status: Never Smoker  . Smokeless tobacco: Never Used  Substance Use Topics  . Alcohol use: Yes    Comment: social  . Drug use: No     Allergies   Penicillins   Review of Systems Review of Systems  Gastrointestinal: Positive for abdominal pain and vomiting.  All other systems reviewed and are negative.    Physical Exam  Updated Vital Signs BP (!) 126/96 (BP Location: Right Arm)   Pulse 82   Temp 97.9 F (36.6 C) (Oral)   Resp 14   Ht 5\' 5"  (1.651 m)   Wt 66.7 kg   SpO2 100%   BMI 24.46 kg/m   Physical Exam Vitals signs and nursing note reviewed.  Constitutional:      Comments: Anxious   HENT:     Head: Normocephalic.     Mouth/Throat:     Comments: MM slightly dry  Eyes:     Extraocular Movements: Extraocular movements intact.  Cardiovascular:     Rate and Rhythm: Regular rhythm. Tachycardia present.  Pulmonary:     Effort: Pulmonary effort is  normal.     Breath sounds: Normal breath sounds.  Abdominal:     General: Bowel sounds are normal.     Palpations: Abdomen is soft.     Comments: + epigastric and mild RUQ tenderness, neg murphy's. No R CVAT   Skin:    General: Skin is warm.     Capillary Refill: Capillary refill takes less than 2 seconds.  Neurological:     General: No focal deficit present.     Mental Status: She is alert.  Psychiatric:        Mood and Affect: Mood normal.        Behavior: Behavior normal.      ED Treatments / Results  Labs (all labs ordered are listed, but only abnormal results are displayed) Labs Reviewed  CBC WITH DIFFERENTIAL/PLATELET - Abnormal; Notable for the following components:      Result Value   RDW 11.2 (*)    All other components within normal limits  COMPREHENSIVE METABOLIC PANEL - Abnormal; Notable for the following components:   Sodium 134 (*)    Potassium 2.5 (*)    CO2 21 (*)    Glucose, Bld 110 (*)    AST 51 (*)    ALT 70 (*)    All other components within normal limits  URINALYSIS, ROUTINE W REFLEX MICROSCOPIC - Abnormal; Notable for the following components:   Hgb urine dipstick LARGE (*)    Ketones, ur 20 (*)    All other components within normal limits  RAPID URINE DRUG SCREEN, HOSP PERFORMED - Abnormal; Notable for the following components:   Tetrahydrocannabinol POSITIVE (*)    All other components within normal limits  ACETAMINOPHEN LEVEL - Abnormal; Notable for the following components:   Acetaminophen (Tylenol), Serum <10 (*)    All other components within normal limits  LIPASE, BLOOD  ETHANOL  SALICYLATE LEVEL  MAGNESIUM  I-STAT BETA HCG BLOOD, ED ([facility], [facility], [facility] ONLY)    EKG EKG Interpretation  Date/Time:  Tuesday April 04 2019 12:15:32 EDT Ventricular Rate:  68 PR Interval:    QRS Duration: 95 QT Interval:  423 QTC Calculation: 450 R Axis:   64 Text Interpretation:  Sinus rhythm Short PR interval Baseline wander in lead(s) II aVR aVF No  significant change since last tracing Confirmed by Richardean Canal [phone]) on 04/04/2019 1:04:25 PM   Radiology US Abdomen Limited Ruq  Result Date: 04/04/2019 CLINICAL DATA:  Epigastric pain and vomiting for 5 days. EXAM: ULTRASOUND ABDOMEN LIMITED RIGHT UPPER QUADRANT COMPARISON:  None. FINDINGS: Gallbladder: No gallstones or wall thickening visualized. No sonographic Murphy sign noted by sonographer. Common bile duct: Diameter: 3 mm Liver: No focal lesion identified. Within normal limits in parenchymal echogenicity. Portal vein is patent on color Doppler imaging with  normal direction of blood flow towards the liver. IMPRESSION: Unremarkable right upper quadrant ultrasound. Electronically Signed   By: Sebastian AcheAllen  Grady M.D.   On: 04/04/2019 12:24    Procedures Procedures (including critical care time)  Medications Ordered in ED Medications  potassium chloride 10 mEq in 100 mL IVPB (10 mEq Intravenous New Bag/Given 04/04/19 1525)  sodium chloride 0.9 % bolus 1,000 mL (0 mLs Intravenous Stopped 04/04/19 1356)  ondansetron (ZOFRAN) injection 4 mg (4 mg Intravenous Given 04/04/19 1214)  potassium chloride SA (K-DUR,KLOR-CON) CR tablet 40 mEq (40 mEq Oral Given 04/04/19 1320)  sodium chloride 0.9 % bolus 1,000 mL (1,000 mLs Intravenous New Bag/Given 04/04/19 1356)  promethazine (PHENERGAN) injection 12.5 mg (12.5 mg Intravenous Given 04/04/19 1425)     Initial Impression / Assessment and Plan / ED Course  I have reviewed the triage vital signs and the nursing notes.  Pertinent labs & imaging results that were available during my care of the patient were reviewed by me and considered in my medical decision making (see chart for details).       Thornton Parklla Bo is a 23 y.o. female here with abdominal pain. Likely gastroenteritis vs gastritis vs pancreatitis vs hepatitis vs biliary colic. Will get labs, RUQ US. Will hydrate and reassess.   2 pm Patient's K is 2.5. Magnesium normal. Hypokalemia likely from  vomiting. RUQ US unremarkable. Ordered potassium IV and PO.   4:23 PM 3 runs of potassium finished. UDS + marijuana which can contribute to her vomiting. Tolerated PO in the ED. I think cramps likely from hypokalemia. Will dc home with zofran, potassium pills, bentyl.    Final Clinical Impressions(s) / ED Diagnoses   Final diagnoses:  Epigastric pain  Hypokalemia    ED Discharge Orders         Ordered    potassium chloride SA (K-DUR,KLOR-CON) 20 MEQ tablet  Daily     04/04/19 1536    dicyclomine (BENTYL) 20 MG tablet  2 times daily PRN     04/04/19 1536    ondansetron (ZOFRAN ODT) 4 MG disintegrating tablet     04/04/19 1537           Charlynne PanderYao, Nimah Uphoff Hsienta, MD 04/04/19 1624

## 2019-04-04 NOTE — Discharge Instructions (Addendum)
Take potassium daily as prescribed.   Stay hydrated.   Take zofran for nausea.   Take bentyl for cramps   See your doctor  Repeat potassium level in a week   See your doctor  Return to ER if you have worse abdominal pain, vomiting, fever.

## 2019-04-04 NOTE — ED Notes (Signed)
Patient aware urine sample is needed. Patient stated she would let us know when she could provide a sample as she could not pee at this time.

## 2019-04-04 NOTE — ED Notes (Signed)
PT C/O PAIN

## 2019-07-18 ENCOUNTER — Ambulatory Visit (HOSPITAL_COMMUNITY)
Admission: EM | Admit: 2019-07-18 | Discharge: 2019-07-18 | Disposition: A | Discharge status: Home / Self Care | Payer: BC Managed Care – PPO | Attending: Urgent Care | Admitting: Urgent Care

## 2019-07-18 ENCOUNTER — Ambulatory Visit (INDEPENDENT_AMBULATORY_CARE_PROVIDER_SITE_OTHER): Payer: BC Managed Care – PPO

## 2019-07-18 ENCOUNTER — Encounter (HOSPITAL_COMMUNITY): Payer: Self-pay

## 2019-07-18 ENCOUNTER — Other Ambulatory Visit: Payer: Self-pay

## 2019-07-18 DIAGNOSIS — M79644 Pain in right finger(s): Secondary | ICD-10-CM

## 2019-07-18 DIAGNOSIS — W25XXXA Contact with sharp glass, initial encounter: Secondary | ICD-10-CM | POA: Diagnosis not present

## 2019-07-18 DIAGNOSIS — Z23 Encounter for immunization: Secondary | ICD-10-CM | POA: Diagnosis not present

## 2019-07-18 DIAGNOSIS — S61216A Laceration without foreign body of right little finger without damage to nail, initial encounter: Secondary | ICD-10-CM | POA: Diagnosis not present

## 2019-07-18 MED ORDER — TETANUS-DIPHTH-ACELL PERTUSSIS 5-2.5-18.5 LF-MCG/0.5 IM SUSP
0.5000 mL | Freq: Once | INTRAMUSCULAR | Status: AC
  Administered 2019-07-18: 0.5 mL via INTRAMUSCULAR

## 2019-07-18 MED ORDER — TETANUS-DIPHTH-ACELL PERTUSSIS 5-2.5-18.5 LF-MCG/0.5 IM SUSP
INTRAMUSCULAR | Status: AC
  Filled 2019-07-18: qty 0.5

## 2019-07-18 NOTE — Discharge Instructions (Signed)
You may take 500mg  Tylenol with ibuprofen 400-600mg  every 6 hours for pain and inflammation.   WOUND CARE Please return in 7-10 days to have your stitches/staples removed or sooner if you have concerns.  Keep area clean and dry for 24 hours. Do not remove bandage, if applied.  After 24 hours, remove bandage and wash wound gently with mild soap and warm water. Reapply a new bandage after cleaning wound, if directed.  Continue daily cleansing with soap and water until stitches/staples are removed.  Do not apply any ointments or creams to the wound while stitches/staples are in place, as this may cause delayed healing.  Notify the office if you experience any of the following signs of infection: Swelling, redness, pus drainage, streaking, fever >101.0 F  Notify the office if you experience excessive bleeding that does not stop after 15-20 minutes of constant, firm pressure.

## 2019-07-18 NOTE — ED Triage Notes (Signed)
Pt states she was washing dishes and a glass broke and cut her right hand (pinky) this happened last night.

## 2019-07-18 NOTE — ED Provider Notes (Addendum)
MRN: [id number] DOB: 1996-06-30  Subjective:   Tamara Kent Howze is a 23 y.o. female presenting for right pinky finger pain following a laceration while washing dishes.  Patient states that she was cut by glass and is worried that there is a piece of glass still stuck in there given that her wound is very tender.  Denies redness, drainage of pus, swelling.  She cannot recall her last [place]ap, [place] was last in 2009.   Current Facility-Administered Medications:  .  Tdap (BOOSTRIX) injection 0.5 mL, 0.5 mL, Intramuscular, Once, Wallis BambergMani, Pilar Corrales, PA-C  Current Outpatient Medications:  .  anti-nausea (EMETROL) solution, Take 10 mLs by mouth every 15 (fifteen) minutes as needed for nausea or vomiting., Disp: , Rfl:  .  bismuth subsalicylate (PEPTO BISMOL) 262 MG/15ML suspension, Take 30 mLs by mouth every 6 (six) hours as needed for indigestion or diarrhea or loose stools., Disp: , Rfl:  .  dicyclomine (BENTYL) 20 MG tablet, Take 1 tablet (20 mg total) by mouth 2 (two) times daily as needed for spasms. (Patient not taking: Reported on 07/18/2019), Disp: 20 tablet, Rfl: 0 .  ibuprofen (ADVIL,MOTRIN) 200 MG tablet, Take 400 mg by mouth every 6 (six) hours as needed for fever, headache or moderate pain., Disp: , Rfl:  .  Multiple Vitamin (MULTIVITAMIN WITH MINERALS) TABS tablet, Take 1 tablet by mouth daily., Disp: , Rfl:  .  XULANE 150-35 MCG/24HR transdermal patch, Apply 1 patch topically once a week., Disp: , Rfl:     Allergies  Allergen Reactions  . Penicillins Hives and Other (See Comments)    Has patient had a PCN reaction causing immediate rash, facial/tongue/throat swelling, SOB or lightheadedness with hypotension: Yes Has patient had a PCN reaction causing severe rash involving mucus membranes or skin necrosis: No Has patient had a PCN reaction that required hospitalization No Has patient had a PCN reaction occurring within the last 10 years: Yes If all of the above answers are  "NO", then may proceed with Cephalosporin use. Throat Swelling.    History reviewed. No pertinent past medical history.   History reviewed. No pertinent surgical history.  ROS  Objective:   Vitals: BP 140/82 (BP Location: Right Arm)   Pulse 78   Temp 97.9 F (36.6 C) (Temporal)   Resp 16   Wt 150 lb (68 kg)   LMP 07/11/2019   SpO2 100%   BMI 24.96 kg/m    Physical Exam Constitutional:      General: She is not in acute distress.    Appearance: Normal appearance. She is well-developed. She is not ill-appearing.  HENT:     Head: Normocephalic and atraumatic.     Nose: Nose normal.     Mouth/Throat:     Mouth: Mucous membranes are moist.     Pharynx: Oropharynx is clear.  Eyes:     General: No scleral icterus.    Extraocular Movements: Extraocular movements intact.     Pupils: Pupils are equal, round, and reactive to light.  Cardiovascular:     Rate and Rhythm: Normal rate.  Pulmonary:     Effort: Pulmonary effort is normal.  Musculoskeletal:     Right hand: She exhibits tenderness (Exquisite tenderness about her wound) and laceration (Extending approximately 1 cm). She exhibits normal range of motion, no bony tenderness, normal capillary refill, no deformity and no swelling. Normal sensation noted. Normal strength noted.       Hands:  Skin:    General: Skin is  warm and dry.  Neurological:     General: No focal deficit present.     Mental Status: She is alert and oriented to person, place, and time.  Psychiatric:        Mood and Affect: Mood normal.        Behavior: Behavior normal.      Dg Finger Little Right  Result Date: 07/18/2019 CLINICAL DATA:  Laceration of the right little finger. EXAM: RIGHT LITTLE FINGER 2+V COMPARISON:  None. FINDINGS: There is no evidence of fracture or dislocation. There is no evidence of arthropathy or other focal bone abnormality. No visible foreign body in the soft tissues. IMPRESSION: 1. Normal exam. 2. No radiopaque foreign  body. Electronically Signed   By: Lorriane Shire M.D.   On: 07/18/2019 12:53    PROCEDURE NOTE: laceration repair Verbal consent obtained from patient.  Local anesthesia with 1cc Lidocaine 2% without epinephrine.  Wound explored for tendon, ligament damage and foreign body, none was observed. Wound scrubbed with soap and water and rinsed. Wound closed with #2 4-0 Prolene (simple interrupted) sutures.  Wound cleansed and dressed.   Assessment and Plan :   1. Laceration of right little finger without damage to nail, foreign body presence unspecified, initial encounter   2. Finger pain, right     Laceration repaired successfully. Wound care reviewed. Return-to-clinic precautions discussed, patient verbalized understanding. Otherwise, follow up in 7 to 10 days for suture removal.     Jaynee Eagles, PA-C 07/18/19 [place], [place] 08/01/19 [phone]

## 2019-10-02 IMAGING — US ULTRASOUND ABDOMEN LIMITED
1 series · 14 of 25 positions shown · non-contrast
Comparison: None.

CLINICAL DATA: Epigastric pain and vomiting for 5 days.

EXAM:
ULTRASOUND ABDOMEN LIMITED RIGHT UPPER QUADRANT

[Series 1: ultrasound abdomen limited · 14 of 42 slices shown]
[im 1/42]
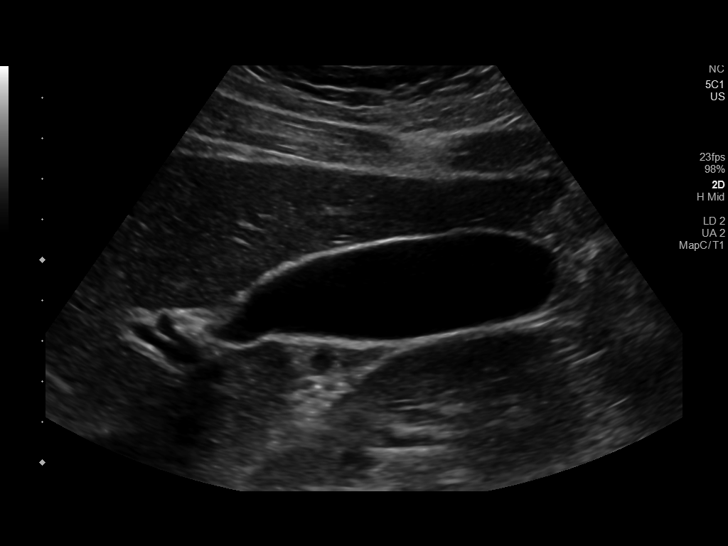
[im 4/42]
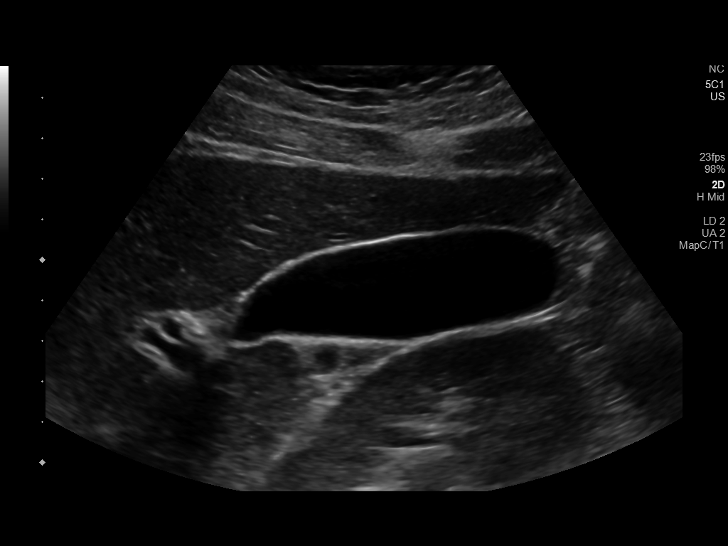
[im 7/42]
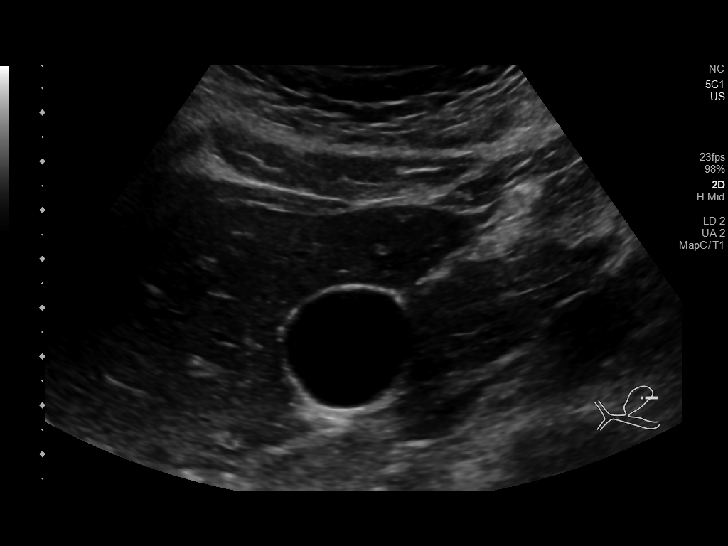
[im 11/42]
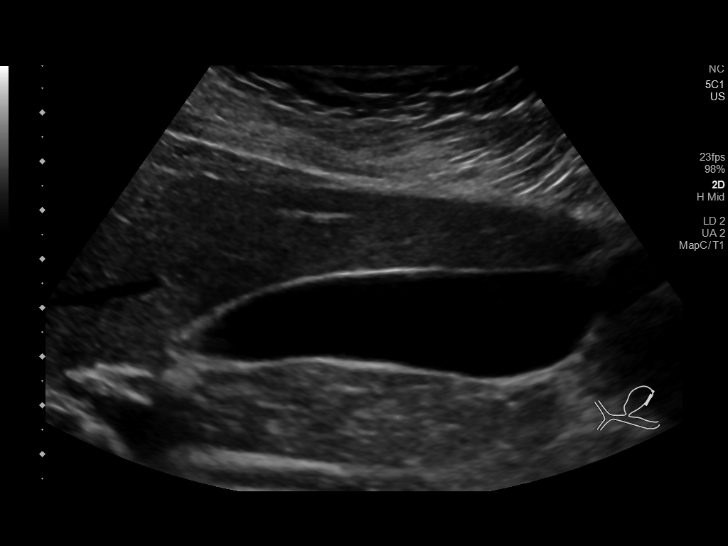
[im 14/42]
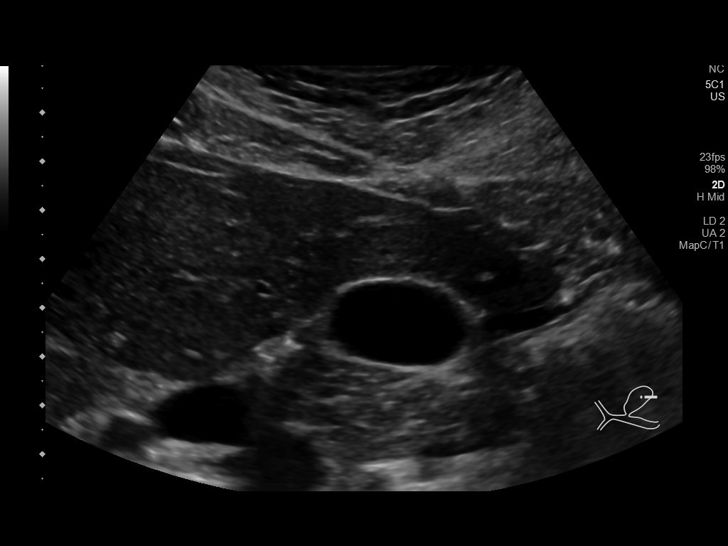
[im 16/42]
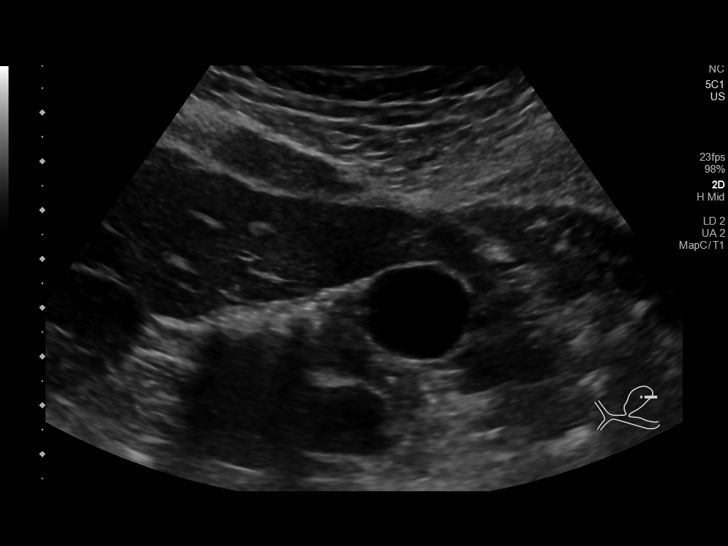
[im 19/42]
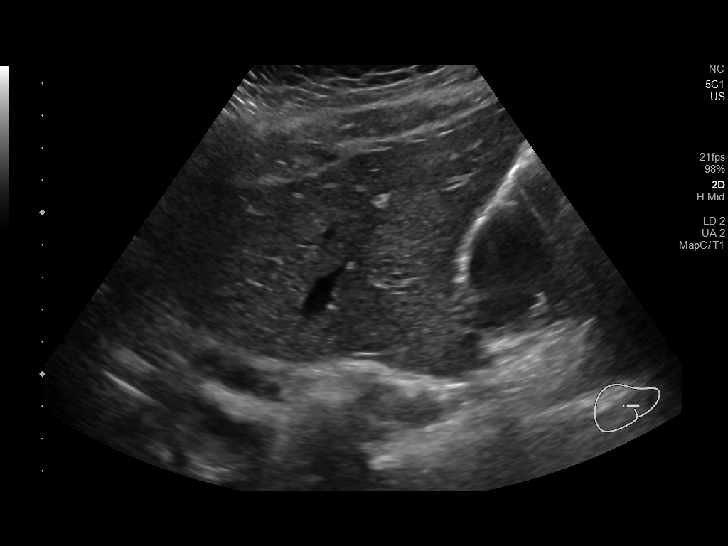
[im 23/42]
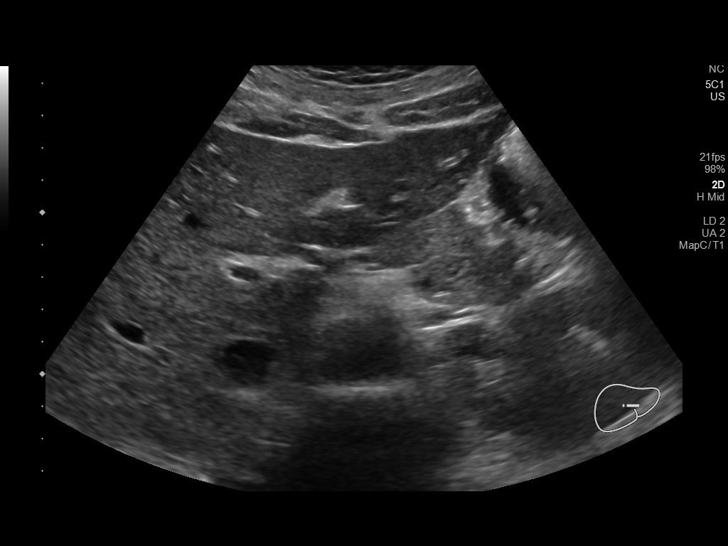
[im 26/42]
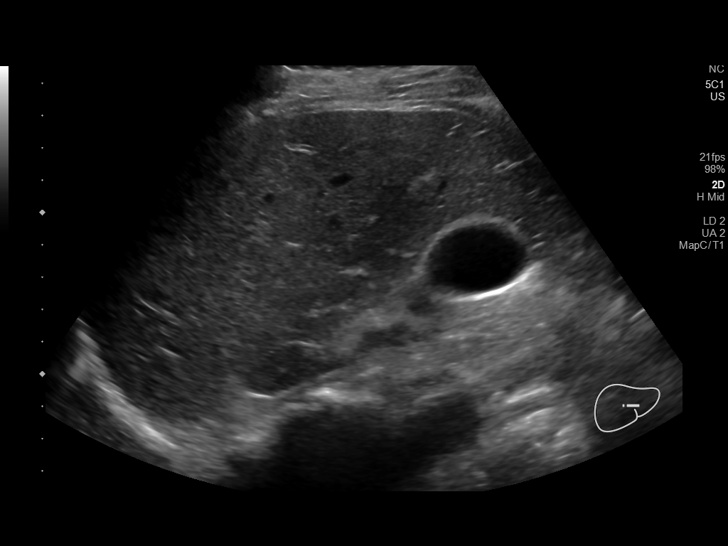
[im 28/42]
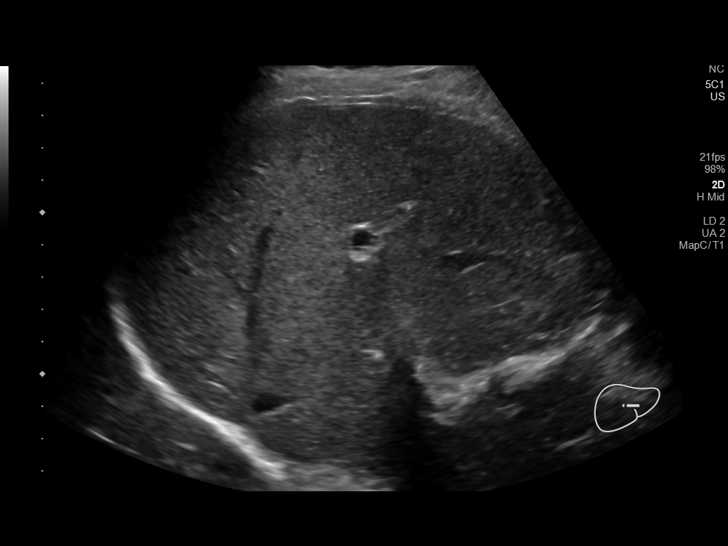
[im 31/42]
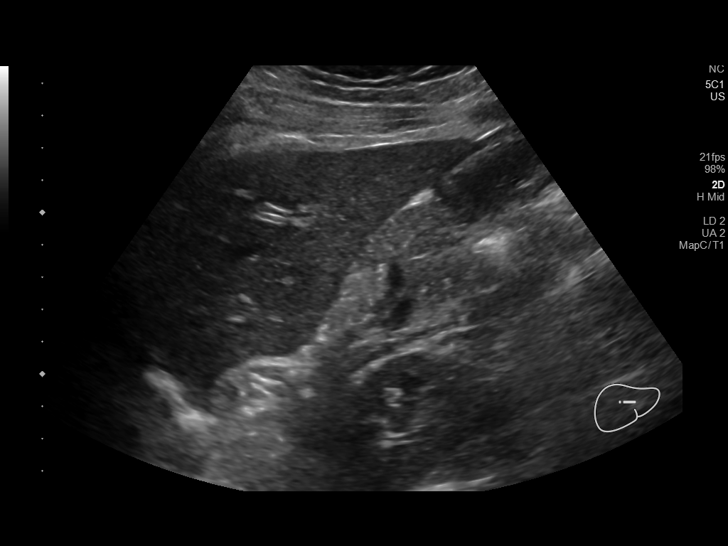
[im 35/42]
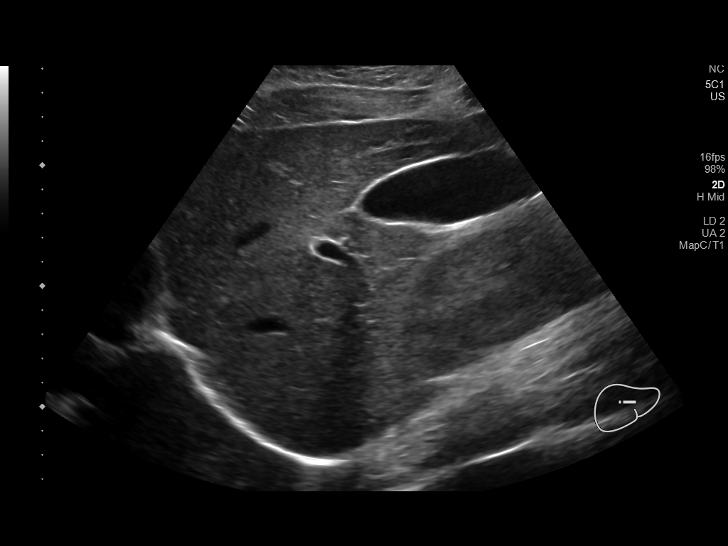
[im 38/42]
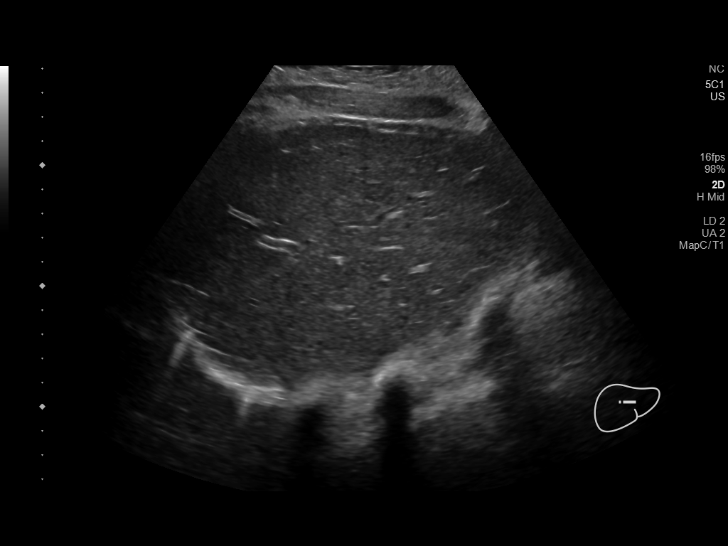
[im 42/42]
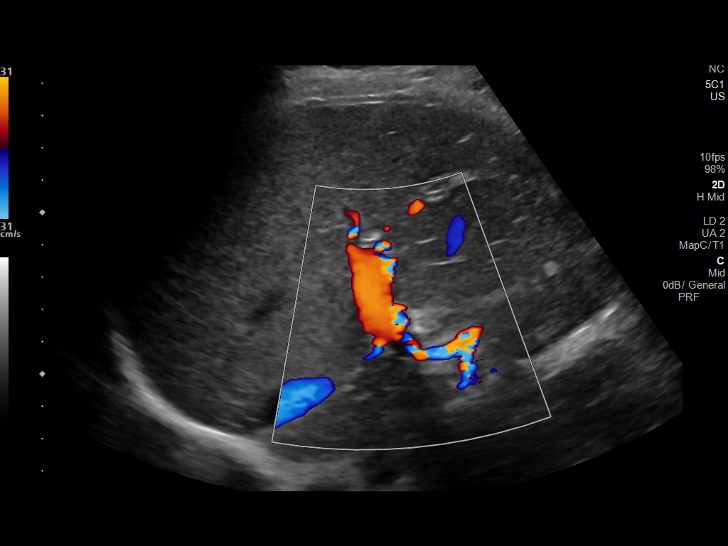

[14 of 25 positions shown; findings below may reference images not displayed]

FINDINGS: Gallbladder:

No gallstones or wall thickening visualized. No sonographic Murphy
sign noted by sonographer.

Common bile duct:

Diameter: 3 mm

Liver:

No focal lesion identified. Within normal limits in parenchymal
echogenicity. Portal vein is patent on color Doppler imaging with
normal direction of blood flow towards the liver.
IMPRESSION: Unremarkable right upper quadrant ultrasound.

## 2020-01-15 IMAGING — DX RIGHT LITTLE FINGER 2+V
3 series · 3 of 3 positions shown · non-contrast
Comparison: None.

CLINICAL DATA: Laceration of the right little finger.

EXAM:
RIGHT LITTLE FINGER 2+V

[finger ap]
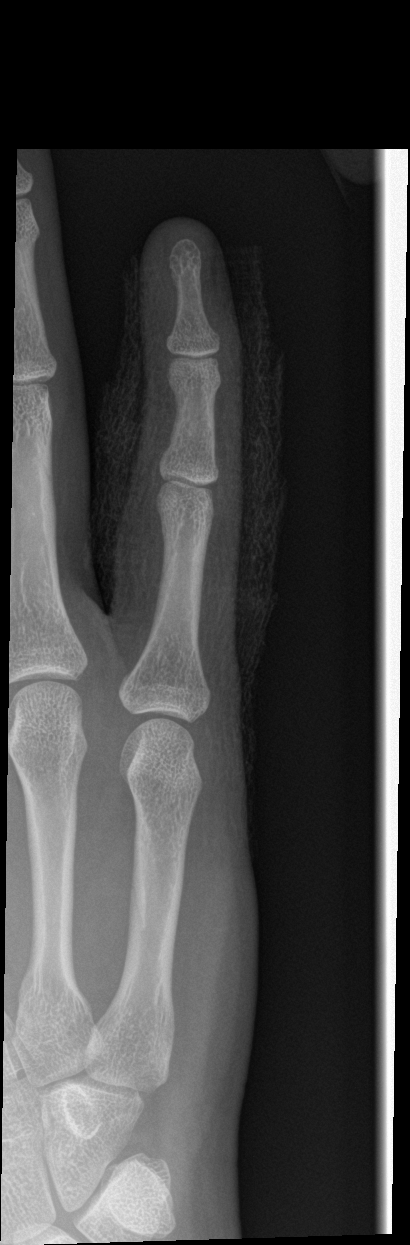

[finger obl]
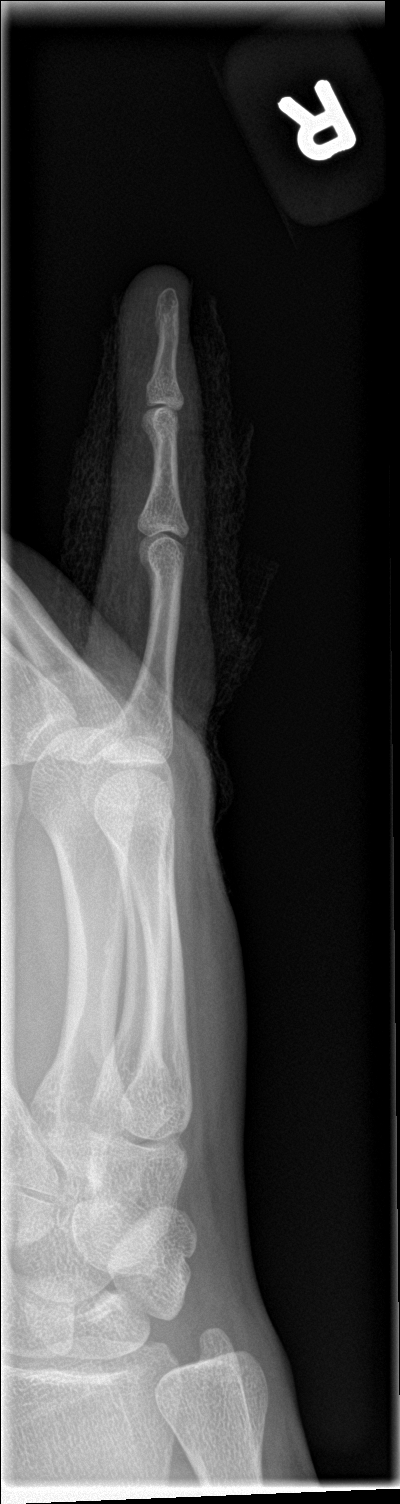

[finger lat]
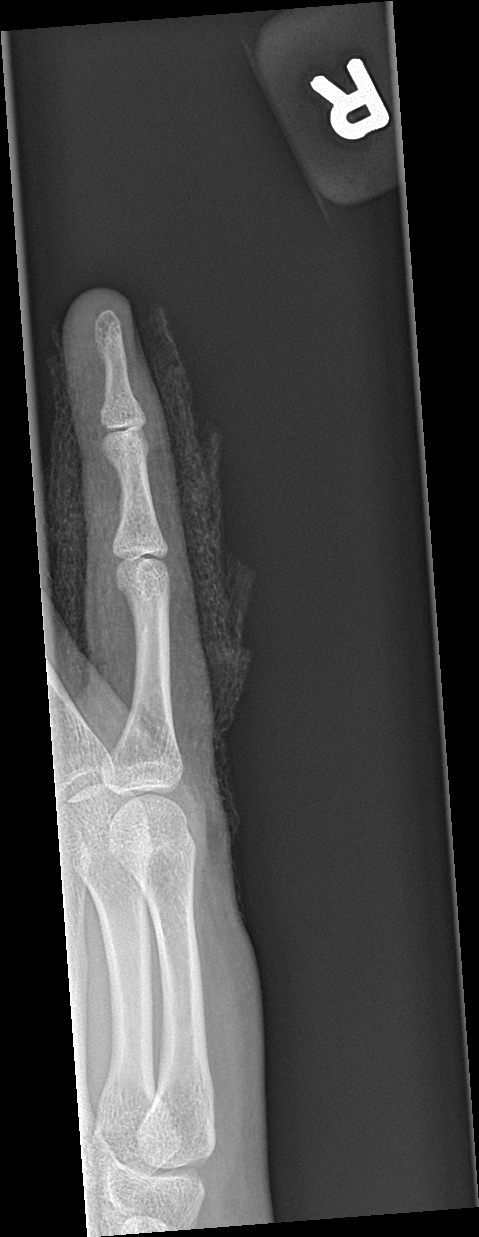

[3 of 3 positions shown; findings below may reference images not displayed]

FINDINGS: There is no evidence of fracture or dislocation. There is no
evidence of arthropathy or other focal bone abnormality. No visible
foreign body in the soft tissues.
IMPRESSION: 1. Normal exam.
2. No radiopaque foreign body.
